# Patient Record
Sex: Male | Born: 1945 | Race: White | Hispanic: No | Marital: Single
Health system: Southern US, Community
[De-identification: ages and names within clinical notes are randomized; demographics above are authoritative.]

---

## 2006-12-23 ENCOUNTER — Encounter: Admission: RE | Admit: 2006-12-23 | Discharge: 2006-12-23 | Payer: Self-pay | Admitting: Family Medicine

## 2008-10-12 IMAGING — CR DG CERVICAL SPINE COMPLETE 4+V
6 series · 6 of 6 positions shown · non-contrast
Comparison: None.

CLINICAL DATA: Neck pain, sternal pain.  
 CERVICAL SPINE SIX VIEWS:

[w c-spine lat]
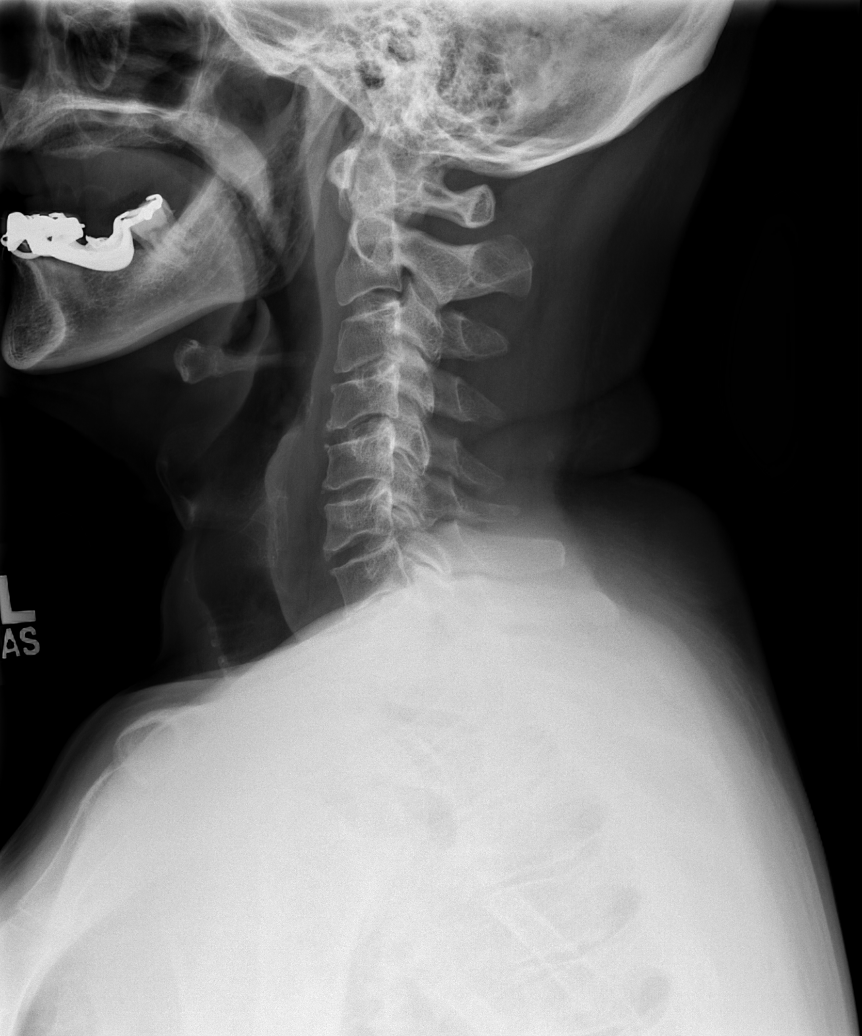

[w c-spine oblique (1 of 2)]
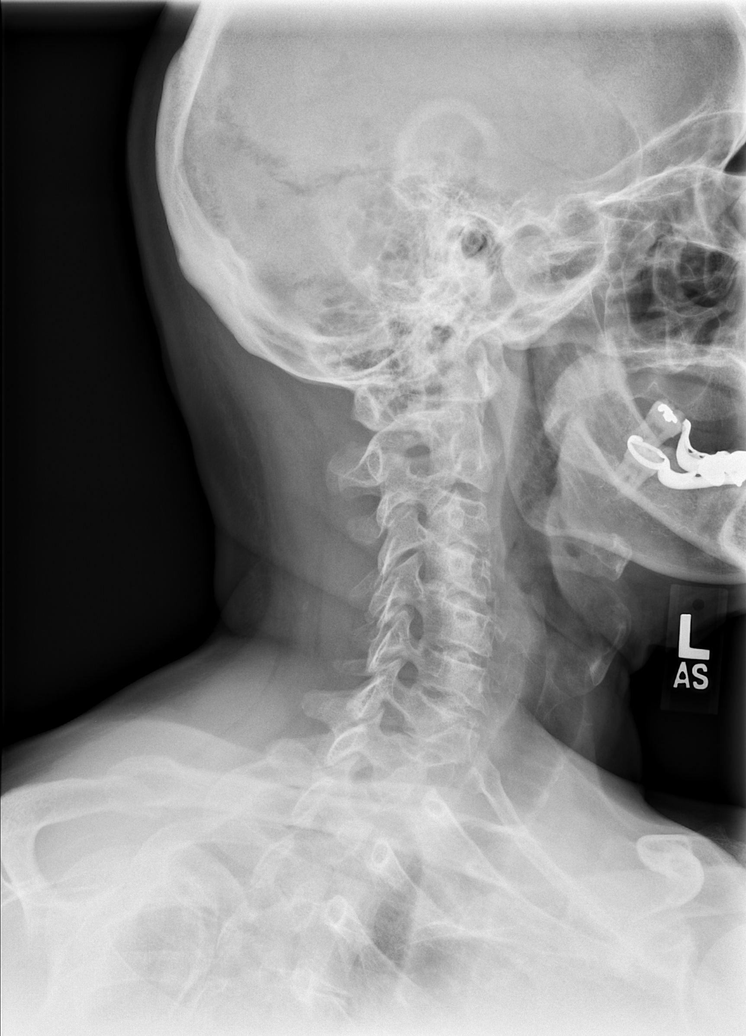

[w c-spine oblique (2 of 2)]
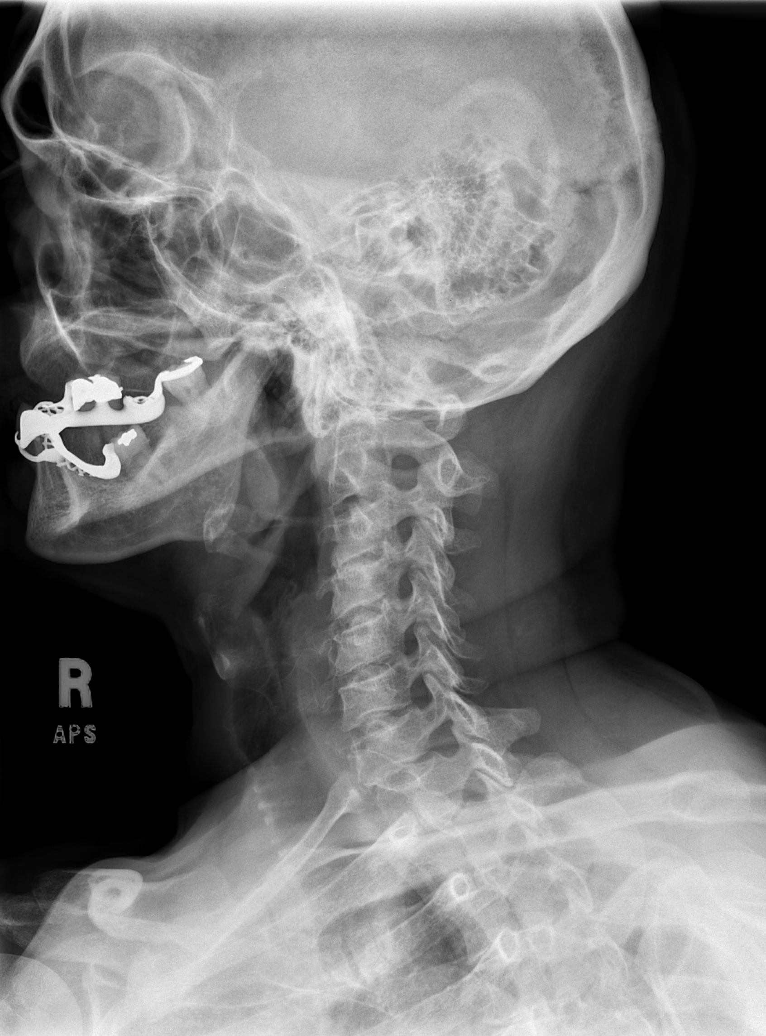

[w c-spine a.p. *]
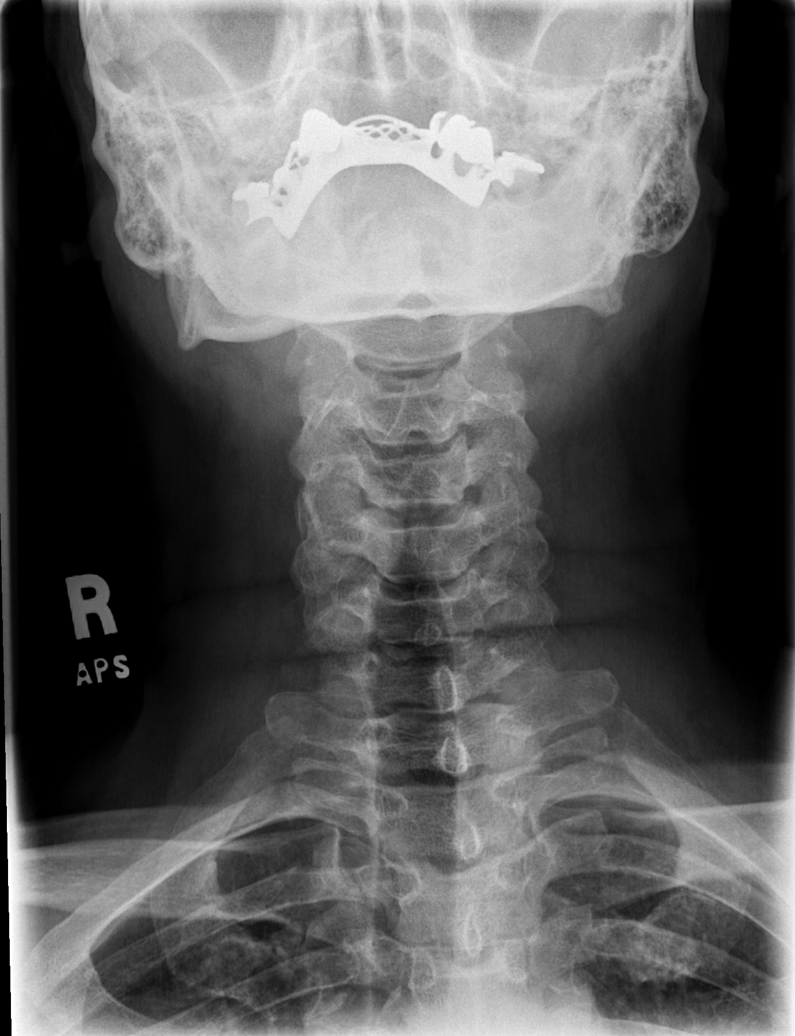

[w c-spine odontoid *]
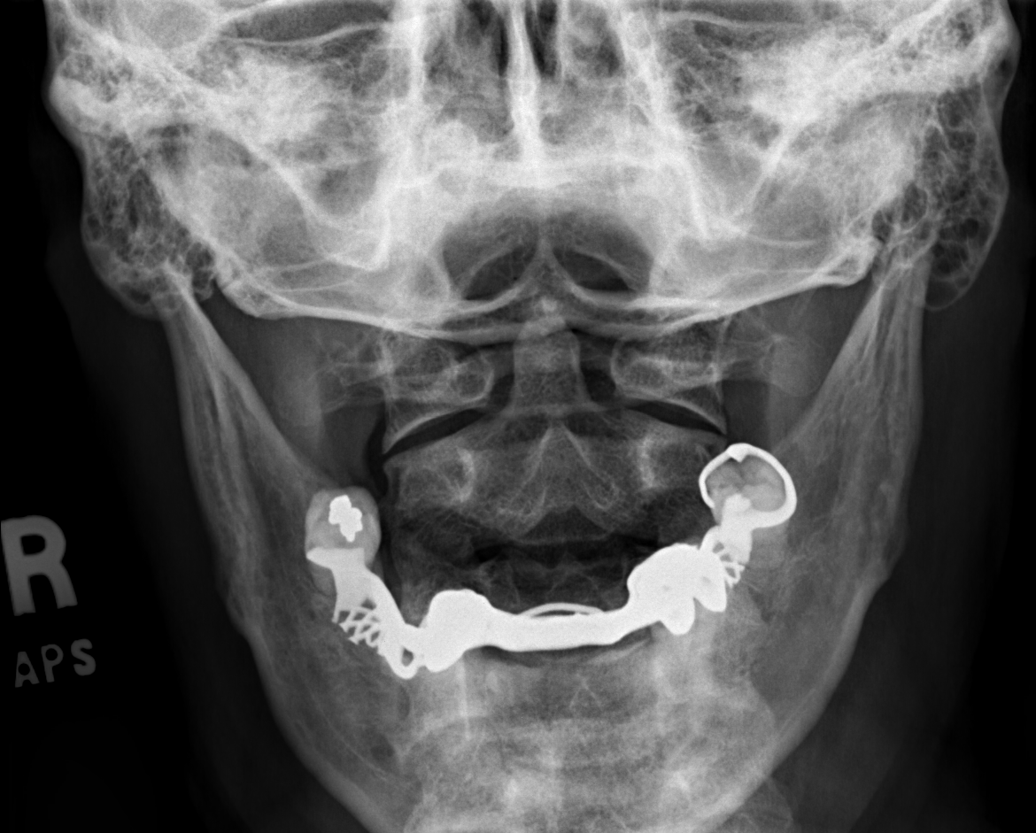

[w swimmers view *]
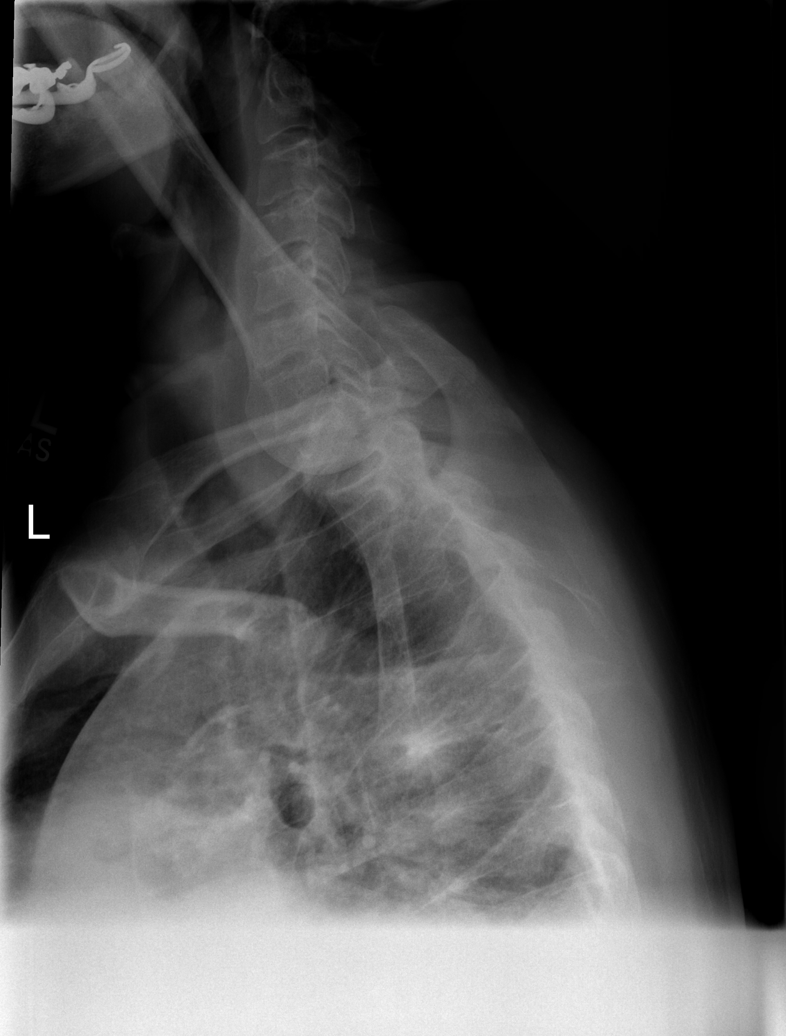

[6 of 6 positions shown; findings below may reference images not displayed]

FINDINGS: Cervical spine is visualized from the occiput to the C7-T1 junction.  Alignment is anatomic, prevertebral soft tissues are within normal limits and there is no fracture.  Uncovertebral hypertrophy and mild endplate degenerative changes are seen throughout.  Disc space height is relatively well maintained with the exception of minimal loss of height at C3-4.  Facet hypertrophy throughout.  Mild loss of the normal cervical lordosis in the upper and mid cervical spine.  Dens is partially obscured on the dedicated view.  Visualized lung apices are clear.
IMPRESSION: Spondylosis without acute finding.

## 2019-02-28 ENCOUNTER — Ambulatory Visit: Payer: Medicare Other | Attending: Internal Medicine

## 2019-02-28 DIAGNOSIS — Z23 Encounter for immunization: Secondary | ICD-10-CM | POA: Insufficient documentation

## 2019-02-28 NOTE — Progress Notes (Signed)
   Covid-19 Vaccination Clinic  Name:  Thomas Taylor    MRN: [id number] DOB: 1945/11/03  02/28/2019  Mr. Thomas Taylor was observed post Covid-19 immunization for 15 minutes without incidence. He was provided with Vaccine Information Sheet and instruction to access the V-Safe system.   Mr. Thomas Taylor was instructed to call 911 with any severe reactions post vaccine: Marland Kitchen Difficulty breathing  . Swelling of your face and throat  . A fast heartbeat  . A bad rash all over your body  . Dizziness and weakness    Immunizations Administered    Name Date Dose VIS Date Route   Pfizer COVID-19 Vaccine 02/28/2019  8:35 AM 0.3 mL 12/19/2018 Intramuscular   Manufacturer: [employer], [employer]   Lot: [id number]   NDC: [address]-7

## 2019-03-24 ENCOUNTER — Ambulatory Visit: Payer: Self-pay | Attending: Internal Medicine

## 2019-03-24 DIAGNOSIS — Z23 Encounter for immunization: Secondary | ICD-10-CM

## 2019-03-24 NOTE — Progress Notes (Signed)
   Covid-19 Vaccination Clinic  Name:  Thomas Taylor    MRN: [id number] DOB: [birth year]  03/24/2019  [name] was observed post Covid-19 immunization for 15 minutes without incident. He was provided with Vaccine Information Sheet and instruction to access the V-Safe system.   [name] was instructed to call 911 with any severe reactions post vaccine: Marland Kitchen Difficulty breathing  . Swelling of face and throat  . A fast heartbeat  . A bad rash all over body  . Dizziness and weakness   Immunizations Administered    Name Date Dose VIS Date Route   Pfizer COVID-19 Vaccine 03/24/2019  9:16 AM 0.3 mL 12/19/2018 Intramuscular   Manufacturer: [employer], [employer]   Lot: [id number]   NDC: [address]-3

## 2019-11-03 ENCOUNTER — Other Ambulatory Visit: Payer: Self-pay | Admitting: Orthopedic Surgery

## 2019-11-03 DIAGNOSIS — M25511 Pain in right shoulder: Secondary | ICD-10-CM

## 2019-11-23 ENCOUNTER — Ambulatory Visit
Admission: RE | Admit: 2019-11-23 | Discharge: 2019-11-23 | Disposition: A | Discharge status: Home / Self Care | Payer: Medicare Other | Source: Ambulatory Visit | Attending: Orthopedic Surgery | Admitting: Orthopedic Surgery

## 2019-11-23 ENCOUNTER — Other Ambulatory Visit: Payer: Self-pay

## 2019-11-23 DIAGNOSIS — M25511 Pain in right shoulder: Secondary | ICD-10-CM

## 2022-01-23 ENCOUNTER — Other Ambulatory Visit: Payer: Self-pay | Admitting: Orthopaedic Surgery

## 2022-01-23 DIAGNOSIS — Z01818 Encounter for other preprocedural examination: Secondary | ICD-10-CM

## 2022-02-21 ENCOUNTER — Ambulatory Visit
Admission: RE | Admit: 2022-02-21 | Discharge: 2022-02-21 | Disposition: A | Discharge status: Home / Self Care | Payer: Medicare Other | Source: Ambulatory Visit | Attending: Orthopaedic Surgery | Admitting: Orthopaedic Surgery

## 2022-02-21 DIAGNOSIS — Z01818 Encounter for other preprocedural examination: Secondary | ICD-10-CM
